# Patient Record
Sex: Female | Born: 1980 | Race: White | Hispanic: No | Marital: Married | Smoking: Current every day smoker
Health system: Southern US, Community
[De-identification: ages and names within clinical notes are randomized; demographics above are authoritative.]

## PROBLEM LIST (undated history)

## (undated) DIAGNOSIS — J302 Other seasonal allergic rhinitis: Secondary | ICD-10-CM

## (undated) DIAGNOSIS — J45909 Unspecified asthma, uncomplicated: Secondary | ICD-10-CM

## (undated) DIAGNOSIS — F329 Major depressive disorder, single episode, unspecified: Secondary | ICD-10-CM

## (undated) DIAGNOSIS — F32A Depression, unspecified: Secondary | ICD-10-CM

## (undated) HISTORY — PX: CHOLECYSTECTOMY: SHX55

---

## 2016-07-17 ENCOUNTER — Emergency Department (HOSPITAL_COMMUNITY)
Admission: EM | Admit: 2016-07-17 | Discharge: 2016-07-17 | Disposition: A | Discharge status: Home / Self Care | Payer: Self-pay | Attending: Emergency Medicine | Admitting: Emergency Medicine

## 2016-07-17 ENCOUNTER — Encounter (HOSPITAL_COMMUNITY): Payer: Self-pay | Admitting: *Deleted

## 2016-07-17 DIAGNOSIS — Z791 Long term (current) use of non-steroidal anti-inflammatories (NSAID): Secondary | ICD-10-CM | POA: Insufficient documentation

## 2016-07-17 DIAGNOSIS — K047 Periapical abscess without sinus: Secondary | ICD-10-CM | POA: Insufficient documentation

## 2016-07-17 MED ORDER — AMOXICILLIN 250 MG PO CAPS
500.0000 mg | ORAL_CAPSULE | Freq: Once | ORAL | Status: AC
  Administered 2016-07-17: 500 mg via ORAL
  Filled 2016-07-17: qty 2

## 2016-07-17 MED ORDER — AMOXICILLIN 500 MG PO CAPS: 500.0000 mg | ORAL_CAPSULE | Freq: Three times a day (TID) | ORAL | 0 refills | Status: AC

## 2016-07-17 MED ORDER — HYDROCODONE-ACETAMINOPHEN 5-325 MG PO TABS
1.0000 | ORAL_TABLET | Freq: Once | ORAL | Status: AC
  Administered 2016-07-17: 1 via ORAL
  Filled 2016-07-17: qty 1

## 2016-07-17 MED ORDER — HYDROCODONE-ACETAMINOPHEN 5-325 MG PO TABS: 1.0000 | ORAL_TABLET | ORAL | 0 refills | Status: AC | PRN

## 2016-07-17 NOTE — ED Notes (Addendum)
Pt reports that her upper incisor broke 6 months ago and since it has been cold has been painful. States she recently moved here and that she has no dentist  Her upper R tooth is broke and decayed to the gum line

## 2016-07-17 NOTE — ED Triage Notes (Signed)
Pt reports upper right dental pain that started Thursday. Pt has no dentist and is new to the area.

## 2016-07-17 NOTE — Discharge Instructions (Signed)
Complete your entire course of antibiotics as prescribed.  You  may use the hydrocodone for pain relief but do not drive within 4 hours of taking as this will make you drowsy.  Cut back on your ibuprofen as discussed, the maximum safe dose is 4 tablets every 8 hours.  Avoid applying heat or ice to your site of pain which can worsen your symptoms.  You may use warm salt water swish and spit treatment or half peroxide and water swish and spit after meals to keep this area clean as discussed.  Call the dentist listed above for further management of your symptoms.

## 2016-07-17 NOTE — ED Provider Notes (Signed)
[facility]-EMER[facility]Y DEPT Provider Note   CSN: [id number] Arrival date & time: 07/17/16  1427     History   Chief Complaint Chief Complaint  Patient presents with  . Dental Pain    HPI Kimberly Hurst is a 36 y.o. female presenting with a chronically fractured tooth for the past 6 months which has become increasingly cold sensitive, but also reporting persistent throbbing pain in the tooth for the past week.   There has been no fevers, chills, nausea or vomiting, also no complaint of difficulty swallowing, although chewing makes pain worse.  The patient has take ibuprofen 800 mg every 6 hours without relief of symptoms.      The history is provided by the patient.    History reviewed. No pertinent past medical history.  There are no active problems to display for this patient.   Past Surgical History:  Procedure Laterality Date  . CESAREAN SECTION     x3  . CHOLECYSTECTOMY      OB History    Gravida Para Term Preterm AB Living   3         3   SAB TAB Ectopic Multiple Live Births                   Home Medications    Prior to Admission medications   Medication Sig Start Date End Date Taking? Authorizing Provider  ibuprofen (ADVIL,MOTRIN) 200 MG tablet Take 800 mg by mouth every 6 (six) hours as needed.   Yes Historical Provider, MD  amoxicillin (AMOXIL) 500 MG capsule Take 1 capsule (500 mg total) by mouth 3 (three) times daily. 07/17/16 07/27/16  Burgess AmorJulie Mabell Esguerra, PA-C  HYDROcodone-acetaminophen (NORCO/VICODIN) 5-325 MG tablet Take 1 tablet by mouth every 4 (four) hours as needed. 07/17/16   Burgess AmorJulie Siboney Requejo, PA-C    Family History No family history on file.  Social History Social History  Substance Use Topics  . Smoking status: Never Smoker  . Smokeless tobacco: Never Used  . Alcohol use No     Allergies   Patient has no known allergies.   Review of Systems Review of Systems  Constitutional: Negative for fever.  HENT: Positive for dental problem. Negative for facial  swelling and sore throat.   Respiratory: Negative for shortness of breath.   Musculoskeletal: Negative for neck pain and neck stiffness.     Physical Exam Updated Vital Signs BP 138/68 (BP Location: Left Arm)   Pulse 76   Temp 97.2 F (36.2 C) (Temporal)   Resp 20   Ht 5\' 5"  (1.651 m)   Wt 129.3 kg   LMP 07/04/2016   SpO2 100%   BMI 47.43 kg/m   Physical Exam  Constitutional: She is oriented to person, place, and time. She appears well-developed and well-nourished. No distress.  HENT:  Head: Normocephalic and atraumatic.  Right Ear: Tympanic membrane and external ear normal.  Left Ear: Tympanic membrane and external ear normal.  Mouth/Throat: Oropharynx is clear and moist and mucous membranes are normal. No oral lesions. No trismus in the jaw. Dental abscesses present.    Eyes: Conjunctivae are normal.  Neck: Normal range of motion. Neck supple.  Cardiovascular: Normal rate and normal heart sounds.   Pulmonary/Chest: Effort normal.  Abdominal: She exhibits no distension.  Musculoskeletal: Normal range of motion.  Lymphadenopathy:    She has no cervical adenopathy.  Neurological: She is alert and oriented to person, place, and time.  Skin: Skin is warm and dry. No erythema.  Psychiatric: She has a normal mood and affect.     ED Treatments / Results  Labs (all labs ordered are listed, but only abnormal results are displayed) Labs Reviewed - No data to display  EKG  EKG Interpretation None       Radiology No results found.  Procedures Procedures (including critical care time)  Medications Ordered in ED Medications  HYDROcodone-acetaminophen (NORCO/VICODIN) 5-325 MG per tablet 1 tablet (1 tablet Oral Given 07/17/16 1535)  amoxicillin (AMOXIL) capsule 500 mg (500 mg Oral Given 07/17/16 1534)     Initial Impression / Assessment and Plan / ED Course  I have reviewed the triage vital signs and the nursing notes.  Pertinent labs & imaging results that were  available during my care of the patient were reviewed by me and considered in my medical decision making (see chart for details).  Clinical Course     Pt with probable early dental abscess.  Amoxil, hydrocodone, advised ibuprofen only q 8 hours. Dental referrals given.    The patient appears reasonably screened and/or stabilized for discharge and I doubt any other medical condition or other [facility] requiring further screening, evaluation, or treatment in the ED at this time prior to discharge.   Final Clinical Impressions(s) / ED Diagnoses   Final diagnoses:  Dental infection    New Prescriptions Discharge Medication List as of 07/17/2016  3:26 PM    START taking these medications   Details  amoxicillin (AMOXIL) 500 MG capsule Take 1 capsule (500 mg total) by mouth 3 (three) times daily., Starting Sat 07/17/2016, Until Tue 07/27/2016, Print    HYDROcodone-acetaminophen (NORCO/VICODIN) 5-325 MG tablet Take 1 tablet by mouth every 4 (four) hours as needed., Starting Sat 07/17/2016, Print         Burgess Amor, PA-C 07/17/16 1635    Vanetta Mulders, MD 07/20/16 [phone]

## 2016-09-21 ENCOUNTER — Emergency Department (HOSPITAL_COMMUNITY)
Admission: EM | Admit: 2016-09-21 | Discharge: 2016-09-21 | Disposition: A | Discharge status: Home / Self Care | Payer: Self-pay | Attending: Emergency Medicine | Admitting: Emergency Medicine

## 2016-09-21 ENCOUNTER — Encounter (HOSPITAL_COMMUNITY): Payer: Self-pay

## 2016-09-21 DIAGNOSIS — F17210 Nicotine dependence, cigarettes, uncomplicated: Secondary | ICD-10-CM | POA: Insufficient documentation

## 2016-09-21 DIAGNOSIS — K529 Noninfective gastroenteritis and colitis, unspecified: Secondary | ICD-10-CM | POA: Insufficient documentation

## 2016-09-21 LAB — CBC
HEMATOCRIT: 40.2 % (ref 36.0–46.0)
HEMOGLOBIN: 13.2 g/dL (ref 12.0–15.0)
MCH: 28.3 pg (ref 26.0–34.0)
MCHC: 32.8 g/dL (ref 30.0–36.0)
MCV: 86.3 fL (ref 78.0–100.0)
Platelets: 121 10*3/uL — ABNORMAL LOW (ref 150–400)
RBC: 4.66 MIL/uL (ref 3.87–5.11)
RDW: 14.2 % (ref 11.5–15.5)
WBC: 5.8 10*3/uL (ref 4.0–10.5)

## 2016-09-21 LAB — COMPREHENSIVE METABOLIC PANEL
ALBUMIN: 3.7 g/dL (ref 3.5–5.0)
ALT: 18 U/L (ref 14–54)
ANION GAP: 9 (ref 5–15)
AST: 19 U/L (ref 15–41)
Alkaline Phosphatase: 36 U/L — ABNORMAL LOW (ref 38–126)
BUN: 8 mg/dL (ref 6–20)
CO2: 26 mmol/L (ref 22–32)
Calcium: 8.7 mg/dL — ABNORMAL LOW (ref 8.9–10.3)
Chloride: 102 mmol/L (ref 101–111)
Creatinine, Ser: 0.62 mg/dL (ref 0.44–1.00)
GFR calc Af Amer: >60 mL/min (ref >60)
GFR calc non Af Amer: >60 mL/min (ref >60)
GLUCOSE: 87 mg/dL (ref 65–99)
POTASSIUM: 4 mmol/L (ref 3.5–5.1)
SODIUM: 137 mmol/L (ref 135–145)
Total Bilirubin: 0.6 mg/dL (ref 0.3–1.2)
Total Protein: 6.5 g/dL (ref 6.5–8.1)

## 2016-09-21 LAB — URINALYSIS, ROUTINE W REFLEX MICROSCOPIC
Bilirubin Urine: NEGATIVE
Glucose, UA: NEGATIVE mg/dL
HGB URINE DIPSTICK: NEGATIVE
KETONES UR: NEGATIVE mg/dL
Leukocytes, UA: NEGATIVE
NITRITE: NEGATIVE
PROTEIN: NEGATIVE mg/dL
Specific Gravity, Urine: 1.01 (ref 1.005–1.030)
pH: 6 (ref 5.0–8.0)

## 2016-09-21 LAB — I-STAT BETA HCG BLOOD, ED (MC, WL, AP ONLY)

## 2016-09-21 LAB — LIPASE, BLOOD: LIPASE: 10 U/L — AB (ref 11–51)

## 2016-09-21 MED ORDER — SODIUM CHLORIDE 0.9 % IV BOLUS (SEPSIS)
1000.0000 mL | Freq: Once | INTRAVENOUS | Status: AC
  Administered 2016-09-21: 1000 mL via INTRAVENOUS

## 2016-09-21 MED ORDER — ONDANSETRON 4 MG PO TBDP: 4.0000 mg | ORAL_TABLET | Freq: Three times a day (TID) | ORAL | 0 refills | Status: AC | PRN

## 2016-09-21 MED ORDER — ONDANSETRON HCL 4 MG/2ML IJ SOLN
4.0000 mg | Freq: Once | INTRAMUSCULAR | Status: AC
  Administered 2016-09-21: 4 mg via INTRAVENOUS
  Filled 2016-09-21: qty 2

## 2016-09-21 MED ORDER — ACETAMINOPHEN 500 MG PO TABS
1000.0000 mg | ORAL_TABLET | Freq: Once | ORAL | Status: AC
  Administered 2016-09-21: 1000 mg via ORAL
  Filled 2016-09-21: qty 2

## 2016-09-21 NOTE — Discharge Instructions (Signed)
Take your medication as prescribed as needed for nausea. I recommend taking Tylenol and ibuprofen as prescribed over-the-counter as needed for pain and body aches. Continue drinking fluids at home to remain hydrated. I recommend eating a bland diet for the next few days and see her symptoms have improved. Please follow up with a primary care provider from the Resource Guide provided below in 3-4 days as needed. Please return to the Emergency Department if symptoms worsen or new onset of fever, chest pain, difficulty breathing, new/worsening abdominal pain, vomiting, unable to keep fluids down.

## 2016-09-21 NOTE — ED Triage Notes (Signed)
Per Pt, Pt reports having generalized body aches and nausea that started yesterday. Pt reports having two episodes of vomiting since yesterday with photophobia. Pt tearful at triage.

## 2016-09-21 NOTE — ED Provider Notes (Signed)
[facility]-EMERGENCY DEPT Provider Note   CSN: [id number] Arrival date & time: 09/21/16  1049     History   Chief Complaint Chief Complaint  Patient presents with  . Generalized Body Aches  . Nausea    HPI Kimberly Hurst is a 36 y.o. female.  HPI   Patient is a 36 year old female with no pertinent past medical history presents the ED with complaint of generalized body aches. Patient reports yesterday afternoon she began having nausea and one episode of NBNB vomiting. She states that she woke up this morning she had generalized body aches with 2 episodes of vomiting and one episode of nonbloody diarrhea. She also endorses having a mild throbbing temporal headache that started today with associated photophobia. Patient reports having a similar migraine in the past but notes her headache today is less severe. Denies taking any medications at home for her symptoms. Denies fever, chills, dizziness, visual changes, eye drainage, rhinorrhea, sore throat, cough, shortness of breath, chest pain, abdominal pain, urinary symptoms, blood in urine or stool, vaginal bleeding, vaginal discharge, numbness, tingling, weakness, rash. Patient denies any known sick contacts. Denies any recent hospitalizations or recent use of antibiotics. Niacin any recent travel outside of the [place] surgery from fresh water sources. Endorses abdominal surgical history of cholecystectomy and 3 C-sections.  History reviewed. No pertinent past medical history.  There are no active problems to display for this patient.   Past Surgical History:  Procedure Laterality Date  . CESAREAN SECTION     x3  . CHOLECYSTECTOMY      OB History    Gravida Para Term Preterm AB Living   3         3   SAB TAB Ectopic Multiple Live Births                   Home Medications    Prior to Admission medications   Medication Sig Start Date End Date Taking? Authorizing Provider  ibuprofen (ADVIL,MOTRIN) 200 MG tablet Take 800 mg by mouth every  6 (six) hours as needed.   Yes Historical Provider, MD  HYDROcodone-acetaminophen (NORCO/VICODIN) 5-325 MG tablet Take 1 tablet by mouth every 4 (four) hours as needed. Patient not taking: Reported on 09/21/2016 07/17/16   Burgess AmorJulie Idol, PA-C  ondansetron (ZOFRAN ODT) 4 MG disintegrating tablet Take 1 tablet (4 mg total) by mouth every 8 (eight) hours as needed for nausea or vomiting. 09/21/16   Barrett HenleNicole Elizabeth Johnjoseph Rolfe, PA-C    Family History No family history on file.  Social History Social History  Substance Use Topics  . Smoking status: Current Every Day Smoker    Packs/day: 0.50    Types: Cigarettes  . Smokeless tobacco: Never Used  . Alcohol use No     Allergies   Patient has no known allergies.   Review of Systems Review of Systems  Eyes: Positive for photophobia.  Gastrointestinal: Positive for diarrhea, nausea and vomiting.  Musculoskeletal: Positive for myalgias (generalized).  Neurological: Positive for headaches.  All other systems reviewed and are negative.    Physical Exam Updated Vital Signs BP 130/70 (BP Location: Left Arm)   Pulse 99   Temp 97.7 F (36.5 C) (Oral)   Resp 22   Ht 5\' 3"  (1.6 m)   Wt 127.9 kg   LMP 08/13/2016 (Exact Date)   SpO2 100%   BMI 49.95 kg/m   Physical Exam  Constitutional: She is oriented to person, place, and time. She appears well-developed and well-nourished. No  distress.  HENT:  Head: Normocephalic and atraumatic.  Right Ear: Tympanic membrane normal.  Left Ear: Tympanic membrane normal.  Nose: Nose normal. Right sinus exhibits no maxillary sinus tenderness and no frontal sinus tenderness. Left sinus exhibits no maxillary sinus tenderness and no frontal sinus tenderness.  Mouth/Throat: Uvula is midline, oropharynx is clear and moist and mucous membranes are normal. No oropharyngeal exudate, posterior oropharyngeal edema, posterior oropharyngeal erythema or tonsillar abscesses. No tonsillar exudate.  Eyes: Conjunctivae and  EOM are normal. Pupils are equal, round, and reactive to light. Right eye exhibits no discharge. Left eye exhibits no discharge. No scleral icterus.  Neck: Normal range of motion. Neck supple.  Cardiovascular: Normal rate, regular rhythm, normal heart sounds and intact distal pulses.   Pulmonary/Chest: Effort normal and breath sounds normal. No respiratory distress. She has no wheezes. She has no rales. She exhibits no tenderness.  Abdominal: Soft. Bowel sounds are normal. She exhibits no distension and no mass. There is no tenderness. There is no rigidity, no rebound, no guarding, no CVA tenderness and negative Murphy's sign. No hernia.  Musculoskeletal: Normal range of motion. She exhibits no edema.  Lymphadenopathy:    She has no cervical adenopathy.  Neurological: She is alert and oriented to person, place, and time. She has normal strength. No cranial nerve deficit or sensory deficit. Coordination normal.  Skin: Skin is warm and dry. She is not diaphoretic.  Nursing note and vitals reviewed.    ED Treatments / Results  Labs (all labs ordered are listed, but only abnormal results are displayed) Labs Reviewed  LIPASE, BLOOD - Abnormal; Notable for the following:       Result Value   Lipase 10 (*)    All other components within normal limits  COMPREHENSIVE METABOLIC PANEL - Abnormal; Notable for the following:    Calcium 8.7 (*)    Alkaline Phosphatase 36 (*)    All other components within normal limits  CBC - Abnormal; Notable for the following:    Platelets 121 (*)    All other components within normal limits  URINALYSIS, ROUTINE W REFLEX MICROSCOPIC - Abnormal; Notable for the following:    APPearance HAZY (*)    All other components within normal limits  I-STAT BETA HCG BLOOD, ED ([facility], [facility], [facility] ONLY)    EKG  EKG Interpretation None       Radiology No results found.  Procedures Procedures (including critical care time)  Medications Ordered in ED Medications  sodium  chloride 0.9 % bolus 1,000 mL (1,000 mLs Intravenous New Bag/Given 09/21/16 1422)  ondansetron (ZOFRAN) injection 4 mg (4 mg Intravenous Given 09/21/16 1421)  acetaminophen (TYLENOL) tablet 1,000 mg (1,000 mg Oral Given 09/21/16 1422)     Initial Impression / Assessment and Plan / ED Course  I have reviewed the triage vital signs and the nursing notes.  Pertinent labs & imaging results that were available during my care of the patient were reviewed by me and considered in my medical decision making (see chart for details).     Patient presents with generalized body aches, nausea, vomiting and diarrhea and mild headache that started yesterday. Denies any known sick contacts. VSS. Exam unremarkable. No abdominal tenderness. No neuro deficits. Patient given IV fluids, Zofran and Tylenol. Pregnancy negative. Labs unremarkable. On reevaluation  patient reports improvement of symptoms. Patient able to tolerate PO.  Suspect patient's symptoms are likely due to viral gastroenteritis. No indication of appendicitis, bowel obstruction, bowel perforation, cholecystitis, diverticulitis, PID or  ectopic pregnancy.  Patient discharged home with symptomatic treatment and given strict instructions for follow-up with their primary care physician.  I have also discussed reasons to return immediately to the ER.  Patient expresses understanding and agrees with plan.      Final Clinical Impressions(s) / ED Diagnoses   Final diagnoses:  Gastroenteritis, acute    New Prescriptions New Prescriptions   ONDANSETRON (ZOFRAN ODT) 4 MG DISINTEGRATING TABLET    Take 1 tablet (4 mg total) by mouth every 8 (eight) hours as needed for nausea or vomiting.     Satira Sark [place], PA-C 09/21/16 1504    Melene Plan, DO 09/21/16 1521

## 2017-04-23 ENCOUNTER — Emergency Department (HOSPITAL_COMMUNITY)
Admission: EM | Admit: 2017-04-23 | Discharge: 2017-04-23 | Disposition: A | Discharge status: Home / Self Care | Payer: Managed Care, Other (non HMO) | Attending: Emergency Medicine | Admitting: Emergency Medicine

## 2017-04-23 ENCOUNTER — Encounter (HOSPITAL_COMMUNITY): Payer: Self-pay | Admitting: Emergency Medicine

## 2017-04-23 ENCOUNTER — Emergency Department (HOSPITAL_COMMUNITY): Payer: Managed Care, Other (non HMO)

## 2017-04-23 DIAGNOSIS — Z79899 Other long term (current) drug therapy: Secondary | ICD-10-CM | POA: Diagnosis not present

## 2017-04-23 DIAGNOSIS — R110 Nausea: Secondary | ICD-10-CM | POA: Diagnosis not present

## 2017-04-23 DIAGNOSIS — J209 Acute bronchitis, unspecified: Secondary | ICD-10-CM

## 2017-04-23 DIAGNOSIS — R42 Dizziness and giddiness: Secondary | ICD-10-CM | POA: Insufficient documentation

## 2017-04-23 DIAGNOSIS — R05 Cough: Secondary | ICD-10-CM | POA: Diagnosis present

## 2017-04-23 DIAGNOSIS — F17210 Nicotine dependence, cigarettes, uncomplicated: Secondary | ICD-10-CM | POA: Insufficient documentation

## 2017-04-23 MED ORDER — PREDNISONE 20 MG PO TABS: 40.0000 mg | ORAL_TABLET | Freq: Every day | ORAL | 0 refills | Status: DC

## 2017-04-23 MED ORDER — ALBUTEROL SULFATE HFA 108 (90 BASE) MCG/ACT IN AERS: 2.0000 | INHALATION_SPRAY | RESPIRATORY_TRACT | 3 refills | Status: AC | PRN

## 2017-04-23 MED ORDER — ALBUTEROL SULFATE (2.5 MG/3ML) 0.083% IN NEBU
5.0000 mg | INHALATION_SOLUTION | Freq: Once | RESPIRATORY_TRACT | Status: AC
  Administered 2017-04-23: 5 mg via RESPIRATORY_TRACT
  Filled 2017-04-23: qty 6

## 2017-04-23 MED ORDER — BENZONATATE 100 MG PO CAPS: 100.0000 mg | ORAL_CAPSULE | Freq: Three times a day (TID) | ORAL | 0 refills | Status: AC

## 2017-04-23 MED ORDER — BENZONATATE 100 MG PO CAPS
200.0000 mg | ORAL_CAPSULE | Freq: Once | ORAL | Status: AC
  Administered 2017-04-23: 200 mg via ORAL
  Filled 2017-04-23: qty 2

## 2017-04-23 NOTE — ED Notes (Signed)
Patient transported to X-ray 

## 2017-04-23 NOTE — ED Provider Notes (Signed)
[facility] Provider Note   CSN: [id number] Arrival date & time: 04/23/17  4098     History   Chief Complaint Chief Complaint  Patient presents with  . Cough    HPI Kimberly Hurst is a 36 y.o. female.  HPI  The patient is a 36 year old female with prior medical history of asthma as a child, denies taking any current daily medications, states that she has started to have some increased coughing productive of white phlegm with some green specks, also having increased amounts of wheezing over the last several days. No medications at home. She does not have an inhaler. She denies fevers or chills. She feels generally poorly with body aches, dizziness which she describes as intermittent vertigo and a headache. She has not had anybody around her with similar complaints, no recent travel  History reviewed. No pertinent past medical history.  There are no active problems to display for this patient.   Past Surgical History:  Procedure Laterality Date  . CESAREAN SECTION     x3  . CHOLECYSTECTOMY      OB History    Gravida Para Term Preterm AB Living   SAB TAB Ectopic Multiple Live Births                   Home Medications    Prior to Admission medications   Medication Sig Start Date End Date Taking? Authorizing Provider  albuterol (PROVENTIL HFA;VENTOLIN HFA) 108 (90 Base) MCG/ACT inhaler Inhale 2 puffs into the lungs every 4 (four) hours as needed for wheezing or shortness of breath. 04/23/17   Eber Hong, MD  benzonatate (TESSALON) 100 MG capsule Take 1 capsule (100 mg total) by mouth every 8 (eight) hours. 04/23/17   Eber Hong, MD  HYDROcodone-acetaminophen (NORCO/VICODIN) 5-325 MG tablet Take 1 tablet by mouth every 4 (four) hours as needed. Patient not taking: Reported on 09/21/2016 07/17/16   Burgess Amor, PA-C  ibuprofen (ADVIL,MOTRIN) 200 MG tablet Take 800 mg by mouth every 6 (six) hours as needed.    [provider]    ondansetron (ZOFRAN ODT) 4 MG disintegrating tablet Take 1 tablet (4 mg total) by mouth every 8 (eight) hours as needed for nausea or vomiting. 09/21/16   Barrett Henle, PA-C  predniSONE (DELTASONE) 20 MG tablet Take 2 tablets (40 mg total) by mouth daily. 04/23/17   Eber Hong, MD    Family History History reviewed. No pertinent family history.  Social History Social History  Substance Use Topics  . Smoking status: Current Every Day Smoker    Packs/day: 0.50    Types: Cigarettes  . Smokeless tobacco: Never Used  . Alcohol use No     Allergies   Patient has no known allergies.   Review of Systems Review of Systems  Constitutional: Negative for chills and fever.  HENT: Positive for congestion. Negative for sore throat.   Eyes: Negative for visual disturbance.  Respiratory: Positive for cough, shortness of breath and wheezing.   Cardiovascular: Positive for chest pain.  Gastrointestinal: Positive for nausea. Negative for abdominal pain, diarrhea and vomiting.  Genitourinary: Negative for dysuria and frequency.  Musculoskeletal: Negative for back pain and neck pain.  Skin: Negative for rash.  Neurological: Positive for dizziness. Negative for weakness, numbness and headaches.  Hematological: Negative for adenopathy.  Psychiatric/Behavioral: Negative for behavioral problems.     Physical Exam Updated Vital Signs BP 112/80 (BP Location: Right Arm)  Pulse (!) 105   Temp 98.5 F (36.9 C) (Oral)   Resp (!) 22   Ht  (1.6 m)   Wt 130.2 kg (287 lb)   LMP 04/16/2017   SpO2 96%   BMI 50.84 kg/m   Physical Exam  Constitutional: She appears well-developed and well-nourished. No distress.  HENT:  Head: Normocephalic and atraumatic.  Mouth/Throat: Oropharynx is clear and moist. No oropharyngeal exudate.  TM's clear bilaterally  Eyes: Pupils are equal, round, and reactive to light. Conjunctivae and EOM are normal. Right eye exhibits no discharge. Left eye  exhibits no discharge. No scleral icterus.  Neck: Normal range of motion. Neck supple. No JVD present. No thyromegaly present.  Cardiovascular: Normal rate, regular rhythm, normal heart sounds and intact distal pulses.  Exam reveals no gallop and no friction rub.   No murmur heard. Pulmonary/Chest: Effort normal. No respiratory distress. She has wheezes. She has no rales.  Speaks in full sentences, no rales, no increased work of breathing, mild tachypnea  Abdominal: Soft. Bowel sounds are normal. She exhibits no distension and no mass. There is no tenderness.  Musculoskeletal: Normal range of motion. She exhibits no edema or tenderness.  Lymphadenopathy:    She has no cervical adenopathy.  Neurological: She is alert. Coordination normal.  Normal speech, normal gait, normal coordination, normal memory, normal cranial nerves III through XII,  Skin: Skin is warm and dry. No rash noted. No erythema.  Psychiatric: She has a normal mood and affect. Her behavior is normal.  Nursing note and vitals reviewed.    ED Treatments / Results  Labs (all labs ordered are listed, but only abnormal results are displayed) Labs Reviewed - No data to display   Radiology Dg Chest 2 View  Result Date: 04/23/2017 CLINICAL DATA:  Cough with shortness of breath and wheezing. EXAM: CHEST  2 VIEW COMPARISON:  None. FINDINGS: Large lung volumes and mild interstitial coarsening. There is no edema, air bronchogram, effusion, or pneumothorax. Normal heart size and mediastinal contours. IMPRESSION: Question bronchitis and hyperinflation. Electronically Signed   By: Marnee Spring M.D.   On: 04/23/2017 07:51    Procedures Procedures (including critical care time)  Medications Ordered in ED Medications  albuterol (PROVENTIL) (2.5 MG/3ML) 0.083% nebulizer solution 5 mg (not administered)  benzonatate (TESSALON) capsule 200 mg (200 mg Oral Given 04/23/17 0717)     Initial Impression / Assessment and Plan / ED  Course  I have reviewed the triage vital signs and the nursing notes.  Pertinent labs & imaging results that were available during my care of the patient were reviewed by me and considered in my medical decision making (see chart for details).    Presentation consistent with respiratory illness, likely viral, check chest x-ray given the productive sputum with tachypnea and wheezing. Patient will be given albuterol nebulizer, Tessalon, the rest of her exam is unremarkable including her neurologic exam. Suspect inner ear condition with head pressure congestion causing the vertigo. Doubt neurologic cause  Has xray c/w hyperinflation / bronchitis C/w clinical pictures Nebs given Pt stable for d;c.  Vitals:   04/23/17 0709  BP: 112/80  Pulse: (!) 105  Resp: (!) 22  Temp: 98.5 F (36.9 C)  TempSrc: Oral  SpO2: 96%  Weight: 130.2 kg (287 lb)  Height:  (1.6 m)     Final Clinical Impressions(s) / ED Diagnoses   Final diagnoses:  Acute bronchitis, unspecified organism    New Prescriptions New Prescriptions   ALBUTEROL (PROVENTIL  HFA;VENTOLIN HFA) 108 (90 BASE) MCG/ACT INHALER    Inhale 2 puffs into the lungs every 4 (four) hours as needed for wheezing or shortness of breath.   BENZONATATE (TESSALON) 100 MG CAPSULE    Take 1 capsule (100 mg total) by mouth every 8 (eight) hours.   PREDNISONE (DELTASONE) 20 MG TABLET    Take 2 tablets (40 mg total) by mouth daily.     Eber Hong, MD 04/23/17 [phone]

## 2017-04-23 NOTE — ED Triage Notes (Addendum)
Patient c/o cough with congestion, nausea, dizziness, and body aches. Denies any fevers. Per patient took Mucinex yesterday and now has some white foam and green thick sputum with cough. Patient denies ear "pain" but reports ear irritation "itching." Denies any ear drainage.

## 2017-04-23 NOTE — Discharge Instructions (Signed)
Xray shows no pneumonia  Tessalon every 8 hours as needed for coughing Albuterol every 4 hours for cough or wheezing Prednisone daily for 5 days  ER for severe or worsening symptoms.  [facility] Primary Care Doctor List    Kari Baars MD. Specialty: Pulmonary Disease Contact information: [address]  [address]  [place] [address]  [phone]   Syliva Overman, MD. Specialty: [facility] Medicine Contact information: [address]  [place] [address]  [phone]   Lilyan Punt, MD. Specialty: [facility] Medicine Contact information: [address]  [place] [address]  [phone]   Avon Gully, MD Specialty: Internal Medicine Contact information: [address] [place] [address]  [phone]   Catalina Pizza, MD. Specialty: Internal Medicine Contact information: [address]  [place] [address]  [phone]    [facility] (Dr. Selena Batten) Specialty: Family Medicine Contact information: [address]  [place] [address]  [phone]   John Giovanni, MD. Specialty: [facility] Medicine Contact information: [address]  [address]  [place] [address]  [phone]   Carylon Perches, MD. Specialty: Internal Medicine Contact information: [address]  [address]  [place] [address]  [phone]    [facility]  [address] [place], [place] [address] [phone]  Services [facility] offers a variety of basic health services.  Services include but are not limited to: Blood pressure checks  Heart rate checks  Blood sugar checks  Urine analysis  Rapid strep tests  Pregnancy tests.  Health education and referrals  People needing more complex services will be directed to a physician online. Using these virtual visits, doctors can evaluate and prescribe medicine and treatments. There will be no medication on-site,  though [facility] will help patients fill their prescriptions at little to no cost.   For More information please go to: [email or web address]

## 2017-09-19 ENCOUNTER — Ambulatory Visit: Payer: Managed Care, Other (non HMO) | Admitting: Nurse Practitioner

## 2017-09-19 DIAGNOSIS — Z0289 Encounter for other administrative examinations: Secondary | ICD-10-CM

## 2017-09-25 ENCOUNTER — Emergency Department (HOSPITAL_COMMUNITY): Payer: Managed Care, Other (non HMO)

## 2017-09-25 ENCOUNTER — Emergency Department (HOSPITAL_COMMUNITY)
Admission: EM | Admit: 2017-09-25 | Discharge: 2017-09-25 | Disposition: A | Discharge status: Home / Self Care | Payer: Managed Care, Other (non HMO) | Attending: Emergency Medicine | Admitting: Emergency Medicine

## 2017-09-25 ENCOUNTER — Other Ambulatory Visit: Payer: Self-pay

## 2017-09-25 ENCOUNTER — Encounter (HOSPITAL_COMMUNITY): Payer: Self-pay | Admitting: Emergency Medicine

## 2017-09-25 DIAGNOSIS — R062 Wheezing: Secondary | ICD-10-CM | POA: Diagnosis present

## 2017-09-25 DIAGNOSIS — J4541 Moderate persistent asthma with (acute) exacerbation: Secondary | ICD-10-CM | POA: Diagnosis not present

## 2017-09-25 DIAGNOSIS — F17210 Nicotine dependence, cigarettes, uncomplicated: Secondary | ICD-10-CM | POA: Insufficient documentation

## 2017-09-25 DIAGNOSIS — J069 Acute upper respiratory infection, unspecified: Secondary | ICD-10-CM | POA: Diagnosis not present

## 2017-09-25 DIAGNOSIS — J45901 Unspecified asthma with (acute) exacerbation: Secondary | ICD-10-CM

## 2017-09-25 HISTORY — DX: Unspecified asthma, uncomplicated: J45.909

## 2017-09-25 HISTORY — DX: Major depressive disorder, single episode, unspecified: F32.9

## 2017-09-25 HISTORY — DX: Depression, unspecified: F32.A

## 2017-09-25 HISTORY — DX: Other seasonal allergic rhinitis: J30.2

## 2017-09-25 MED ORDER — DEXAMETHASONE SODIUM PHOSPHATE 10 MG/ML IJ SOLN
10.0000 mg | Freq: Once | INTRAMUSCULAR | Status: AC
  Administered 2017-09-25: 10 mg via INTRAMUSCULAR
  Filled 2017-09-25: qty 1

## 2017-09-25 MED ORDER — DEXAMETHASONE 4 MG PO TABS: 4.0000 mg | ORAL_TABLET | Freq: Two times a day (BID) | ORAL | 0 refills | Status: AC

## 2017-09-25 MED ORDER — IPRATROPIUM-ALBUTEROL 0.5-2.5 (3) MG/3ML IN SOLN
3.0000 mL | Freq: Once | RESPIRATORY_TRACT | Status: AC
Start: 2017-09-25 — End: 2017-09-25
  Administered 2017-09-25: 3 mL via RESPIRATORY_TRACT
  Filled 2017-09-25: qty 3

## 2017-09-25 MED ORDER — ALBUTEROL SULFATE HFA 108 (90 BASE) MCG/ACT IN AERS
3.0000 | INHALATION_SPRAY | Freq: Once | RESPIRATORY_TRACT | Status: AC
Start: 2017-09-25 — End: 2017-09-25
  Administered 2017-09-25: 3 via RESPIRATORY_TRACT
  Filled 2017-09-25: qty 6.7

## 2017-09-25 MED ORDER — ALBUTEROL SULFATE (2.5 MG/3ML) 0.083% IN NEBU: 5.0000 mg | INHALATION_SOLUTION | Freq: Once | RESPIRATORY_TRACT | Status: DC

## 2017-09-25 MED ORDER — DIPHENHYDRAMINE HCL 12.5 MG/5ML PO ELIX
12.5000 mg | ORAL_SOLUTION | Freq: Once | ORAL | Status: AC
Start: 2017-09-25 — End: 2017-09-25
  Administered 2017-09-25: 12.5 mg via ORAL
  Filled 2017-09-25: qty 5

## 2017-09-25 NOTE — Discharge Instructions (Signed)
Your asthma exacerbation shows improvement with albuterol.  You have been treated with steroid medication.  Please use 2 puffs of albuterol every 4 hours for the next 2 days, and then every 4 hours as needed.  Please use Decadron 2 times daily with food.  Please use the decongesting medication of your choice (Dimetapp, Claritin-D, etc.).  Please increase fluids.  Wash hands frequently.  Please stop smoking.

## 2017-09-25 NOTE — ED Provider Notes (Signed)
[facility] EMERGENCY DEPARTMENT Provider Note   CSN: [id number] Arrival date & time: 09/25/17  1454     History   Chief Complaint Chief Complaint  Patient presents with  . Wheezing    HPI Kimberly Hurst is a 37 y.o. female.  Patient is a 37 year old female who presents to the emergency department with complaint of cough and wheezing.  The patient states this problem started on yesterday March 16.  The patient states that she had asthma approximately 20 years ago, has not really had any problems with asthma since that time.  She admits that she is a smoker.  She states that she has had upper respiratory symptoms recently with nasal congestion, cough, aching, and fatigue.  She has not measured any temperature elevations.  No other respiratory related illnesses to be reported.      Past Medical History:  Diagnosis Date  . Asthma   . Depression   . Seasonal allergies     There are no active problems to display for this patient.   Past Surgical History:  Procedure Laterality Date  . CESAREAN SECTION     x3  . CHOLECYSTECTOMY      OB History    Gravida Para Term Preterm AB Living   3 3 3     3    SAB TAB Ectopic Multiple Live Births                   Home Medications    Prior to Admission medications   Medication Sig Start Date End Date Taking? Authorizing Provider  albuterol (PROVENTIL HFA;VENTOLIN HFA) 108 (90 Base) MCG/ACT inhaler Inhale 2 puffs into the lungs every 4 (four) hours as needed for wheezing or shortness of breath. 04/23/17   Eber Hong, MD  benzonatate (TESSALON) 100 MG capsule Take 1 capsule (100 mg total) by mouth every 8 (eight) hours. 04/23/17   Eber Hong, MD  HYDROcodone-acetaminophen (NORCO/VICODIN) 5-325 MG tablet Take 1 tablet by mouth every 4 (four) hours as needed. Patient not taking: Reported on 09/21/2016 07/17/16   Burgess Amor, PA-C  ibuprofen (ADVIL,MOTRIN) 200 MG tablet Take 800 mg by mouth every 6 (six) hours as needed.     [provider]  ondansetron (ZOFRAN ODT) 4 MG disintegrating tablet Take 1 tablet (4 mg total) by mouth every 8 (eight) hours as needed for nausea or vomiting. 09/21/16   Barrett Henle, PA-C  predniSONE (DELTASONE) 20 MG tablet Take 2 tablets (40 mg total) by mouth daily. 04/23/17   Eber Hong, MD    Family History No family history on file.  Social History Social History   Tobacco Use  . Smoking status: Current Every Day Smoker    Packs/day: 0.50    Types: Cigarettes  . Smokeless tobacco: Never Used  Substance Use Topics  . Alcohol use: No  . Drug use: No     Allergies   Patient has no known allergies.   Review of Systems Review of Systems  Constitutional: Positive for fatigue. Negative for activity change and fever.       All ROS Neg except as noted in HPI  HENT: Positive for congestion and postnasal drip. Negative for nosebleeds.   Eyes: Negative for photophobia and discharge.  Respiratory: Positive for cough, shortness of breath and wheezing.   Cardiovascular: Negative for chest pain and palpitations.  Gastrointestinal: Negative for abdominal pain and blood in stool.  Genitourinary: Negative for dysuria, frequency and hematuria.  Musculoskeletal: Negative for arthralgias, back  pain and neck pain.  Skin: Negative.   Neurological: Negative for dizziness, seizures and speech difficulty.  Psychiatric/Behavioral: Negative for confusion and hallucinations.     Physical Exam Updated Vital Signs BP (!) 142/103 (BP Location: Right Arm)   Pulse 92   Temp 98 F (36.7 C) (Oral)   Resp (!) 28 Comment: pt hyperventilating and while temp probe in mout and breathing normally sats 100 %  Ht 5\' 5"  (1.651 m)   Wt (!) 158.8 kg (350 lb)   LMP 09/11/2017   SpO2 99%   BMI 58.24 kg/m   Physical Exam  Constitutional: She is oriented to person, place, and time. She appears well-developed and well-nourished.  Non-toxic appearance.  Morbid obesity  HENT:    Head: Normocephalic.  Right Ear: Tympanic membrane and external ear normal.  Left Ear: Tympanic membrane and external ear normal.  Eyes: EOM and lids are normal. Pupils are equal, round, and reactive to light.  Neck: Normal range of motion. Neck supple. Carotid bruit is not present.  Cardiovascular: Normal rate, regular rhythm, normal heart sounds, intact distal pulses and normal pulses.  Pulmonary/Chest: No respiratory distress. She has wheezes.  Respiratory rate 28 Wheezes noted at the bedside. No use of assessory muscles. Pt speaks in complete sentences.  Abdominal: Soft. Bowel sounds are normal. There is no tenderness. There is no guarding.  Musculoskeletal: Normal range of motion.  1+ edema of the lower extremities.  Lymphadenopathy:       Head (right side): No submandibular adenopathy present.       Head (left side): No submandibular adenopathy present.    She has no cervical adenopathy.  Neurological: She is alert and oriented to person, place, and time. She has normal strength. No cranial nerve deficit or sensory deficit.  Skin: Skin is warm and dry.  Psychiatric: She has a normal mood and affect. Her speech is normal.  Nursing note and vitals reviewed.    ED Treatments / Results  Labs (all labs ordered are listed, but only abnormal results are displayed) Labs Reviewed - No data to display  EKG  EKG Interpretation None       Radiology Dg Chest 2 View  Result Date: 09/25/2017 CLINICAL DATA:  37 year old female with shortness of breath wheezing and cough since yesterday. Right chest pain radiating to the right shoulder and back for 2 days. EXAM: CHEST - 2 VIEW COMPARISON:  Chest radiographs 04/23/2017. FINDINGS: Large body habitus. Lung volumes are stable and within normal limits. Normal cardiac size and mediastinal contours. Visualized tracheal air column is within normal limits. Stable bilateral lung parenchyma. No pneumothorax, pleural effusion or acute pulmonary  opacity. No acute osseous abnormality identified. Paucity of bowel gas in the upper abdomen. IMPRESSION: No acute cardiopulmonary abnormality. Electronically Signed   By: Odessa Fleming M.D.   On: 09/25/2017 15:49    Procedures Procedures (including critical care time)  Medications Ordered in ED Medications  albuterol (PROVENTIL) (2.5 MG/3ML) 0.083% nebulizer solution 5 mg (not administered)     Initial Impression / Assessment and Plan / ED Course  I have reviewed the triage vital signs and the nursing notes.  Pertinent labs & imaging results that were available during my care of the patient were reviewed by me and considered in my medical decision making (see chart for details).      Final Clinical Impressions(s) / ED Diagnoses MDM  Blood pressure is elevated at 142/103.  I have asked the patient to have this rechecked soon.  Patient has wheezing that is audible at the bedside.  She is not using accessory muscles and she is speaking in complete sentences.  Patient was treated with albuterol, Atrovent, and Decadron.  Chest x-ray is negative for pneumonia or any other acute problems.  Recheck patient's respiratory rate has slowed down significantly.  Capillary refill is less than 2 seconds.  Patient states she is no longer short of breath and feels much better.  She continues to have some soft wheezing, but much improved from admission.  Patient is ambulatory with minimal problem.  The patient feels that with the medication she can handle this at home at this point. Patient is given an albuterol inhaler with instructions to use 2 puffs every 4 hours over the next 2 days, and then every 4 hours as needed for wheezing and shortness of breath.  Patient is given a prescription for Decadron twice daily.  The patient will use the decongesting medication of her choice.  I have asked the patient to stop smoking.  I have asked her to see her primary physician or return to the emergency department if any  changes in her condition, problems, or concerns.  Patient is in agreement with this plan.    Final diagnoses:  Moderate asthma with exacerbation, unspecified whether persistent  Upper respiratory tract infection, unspecified type    ED Discharge Orders    None       Ivery QualeBryant, Moria Brophy, PA-C 09/25/17 1752    Donnetta Hutchingook, Brian, MD 09/28/17 [phone]

## 2017-09-25 NOTE — ED Triage Notes (Signed)
Patient c/o wheezing with congested cough that started yesterday. Per patient hx of asthma but states "I haven't had a asthma attack in years." Denies any fevers. Patient states difficulty catching my breath. Patient states that she also has pain in right shoulder that radiates into back and right breast that started 2 days ago.

## 2018-05-30 ENCOUNTER — Other Ambulatory Visit: Payer: Self-pay

## 2018-05-30 ENCOUNTER — Emergency Department (HOSPITAL_COMMUNITY)
Admission: EM | Admit: 2018-05-30 | Discharge: 2018-05-30 | Disposition: A | Discharge status: Home / Self Care | Payer: Self-pay | Attending: Emergency Medicine | Admitting: Emergency Medicine

## 2018-05-30 ENCOUNTER — Encounter (HOSPITAL_COMMUNITY): Payer: Self-pay

## 2018-05-30 ENCOUNTER — Emergency Department (HOSPITAL_COMMUNITY): Payer: Self-pay

## 2018-05-30 DIAGNOSIS — R0602 Shortness of breath: Secondary | ICD-10-CM | POA: Insufficient documentation

## 2018-05-30 DIAGNOSIS — J4521 Mild intermittent asthma with (acute) exacerbation: Secondary | ICD-10-CM | POA: Insufficient documentation

## 2018-05-30 DIAGNOSIS — Z79899 Other long term (current) drug therapy: Secondary | ICD-10-CM | POA: Insufficient documentation

## 2018-05-30 DIAGNOSIS — F17210 Nicotine dependence, cigarettes, uncomplicated: Secondary | ICD-10-CM | POA: Insufficient documentation

## 2018-05-30 LAB — COMPREHENSIVE METABOLIC PANEL
ALT: 21 U/L (ref 0–44)
AST: 24 U/L (ref 15–41)
Albumin: 3.5 g/dL (ref 3.5–5.0)
Alkaline Phosphatase: 40 U/L (ref 38–126)
Anion gap: 7 (ref 5–15)
BUN: 6 mg/dL (ref 6–20)
CHLORIDE: 103 mmol/L (ref 98–111)
CO2: 28 mmol/L (ref 22–32)
Calcium: 8.6 mg/dL — ABNORMAL LOW (ref 8.9–10.3)
Creatinine, Ser: 0.56 mg/dL (ref 0.44–1.00)
Glucose, Bld: 106 mg/dL — ABNORMAL HIGH (ref 70–99)
POTASSIUM: 4.1 mmol/L (ref 3.5–5.1)
SODIUM: 138 mmol/L (ref 135–145)
Total Bilirubin: 0.6 mg/dL (ref 0.3–1.2)
Total Protein: 7.2 g/dL (ref 6.5–8.1)

## 2018-05-30 LAB — CBC WITH DIFFERENTIAL/PLATELET
ABS IMMATURE GRANULOCYTES: 0.05 10*3/uL (ref 0.00–0.07)
BASOS PCT: 0 %
Basophils Absolute: 0 10*3/uL (ref 0.0–0.1)
EOS ABS: 0.1 10*3/uL (ref 0.0–0.5)
Eosinophils Relative: 2 %
HCT: 39 % (ref 36.0–46.0)
Hemoglobin: 12.1 g/dL (ref 12.0–15.0)
IMMATURE GRANULOCYTES: 1 %
Lymphocytes Relative: 20 %
Lymphs Abs: 1.4 10*3/uL (ref 0.7–4.0)
MCH: 27.1 pg (ref 26.0–34.0)
MCHC: 31 g/dL (ref 30.0–36.0)
MCV: 87.2 fL (ref 80.0–100.0)
Monocytes Absolute: 0.3 10*3/uL (ref 0.1–1.0)
Monocytes Relative: 5 %
NEUTROS ABS: 5 10*3/uL (ref 1.7–7.7)
NEUTROS PCT: 72 %
PLATELETS: 171 10*3/uL (ref 150–400)
RBC: 4.47 MIL/uL (ref 3.87–5.11)
RDW: 14.6 % (ref 11.5–15.5)
WBC: 6.9 10*3/uL (ref 4.0–10.5)
nRBC: 0 % (ref 0.0–0.2)

## 2018-05-30 MED ORDER — ALBUTEROL SULFATE (2.5 MG/3ML) 0.083% IN NEBU
2.5000 mg | INHALATION_SOLUTION | Freq: Once | RESPIRATORY_TRACT | Status: AC
  Administered 2018-05-30: 2.5 mg via RESPIRATORY_TRACT
  Filled 2018-05-30: qty 3

## 2018-05-30 MED ORDER — IPRATROPIUM-ALBUTEROL 0.5-2.5 (3) MG/3ML IN SOLN
3.0000 mL | Freq: Once | RESPIRATORY_TRACT | Status: AC
  Administered 2018-05-30: 3 mL via RESPIRATORY_TRACT
  Filled 2018-05-30: qty 3

## 2018-05-30 MED ORDER — METHYLPREDNISOLONE SODIUM SUCC 125 MG IJ SOLR
125.0000 mg | Freq: Once | INTRAMUSCULAR | Status: AC
  Administered 2018-05-30: 125 mg via INTRAVENOUS
  Filled 2018-05-30: qty 2

## 2018-05-30 MED ORDER — PREDNISONE 10 MG PO TABS: 20.0000 mg | ORAL_TABLET | Freq: Every day | ORAL | 0 refills | Status: AC

## 2018-05-30 MED ORDER — ALBUTEROL SULFATE HFA 108 (90 BASE) MCG/ACT IN AERS
2.0000 | INHALATION_SPRAY | RESPIRATORY_TRACT | Status: DC | PRN
  Filled 2018-05-30: qty 6.7

## 2018-05-30 NOTE — ED Provider Notes (Signed)
[facility] EMERGENCY DEPARTMENT Provider Note   CSN: [id number] Arrival date & time: 05/30/18  0757     History   Chief Complaint Chief Complaint  Patient presents with  . Cough  . Shortness of Breath    HPI Kimberly Hurst is a 37 y.o. female.  Patient complains of shortness of breath.  Patient has a history of asthma and has been using her inhaler frequently last few days  The history is provided by the patient. No language interpreter was used.  Cough  This is a new problem. The current episode started 2 days ago. The problem occurs constantly. The problem has not changed since onset.The cough is non-productive. Associated symptoms include shortness of breath and wheezing. Pertinent negatives include no chest pain and no headaches. She has tried nothing for the symptoms. The treatment provided no relief. Risk factors: asthma. She is not a smoker. Her past medical history is significant for asthma.  Shortness of Breath  Associated symptoms include cough and wheezing. Pertinent negatives include no headaches, no chest pain, no abdominal pain and no rash. Associated medical issues include asthma.    Past Medical History:  Diagnosis Date  . Asthma   . Depression   . Seasonal allergies     There are no active problems to display for this patient.   Past Surgical History:  Procedure Laterality Date  . CESAREAN SECTION     x3  . CHOLECYSTECTOMY       OB History    Gravida  3   Para  3   Term  3   Preterm      AB      Living  3     SAB      TAB      Ectopic      Multiple      Live Births               Home Medications    Prior to Admission medications   Medication Sig Start Date End Date Taking? Authorizing Provider  albuterol (PROVENTIL HFA;VENTOLIN HFA) 108 (90 Base) MCG/ACT inhaler Inhale 2 puffs into the lungs every 4 (four) hours as needed for wheezing or shortness of breath. 04/23/17  Yes Eber HongMiller, Brian, MD  FLUoxetine (PROZAC) 20 MG  capsule Take 20 mg by mouth daily.   Yes [provider]  ibuprofen (ADVIL,MOTRIN) 200 MG tablet Take 800 mg by mouth every 6 (six) hours as needed.   Yes [provider]  benzonatate (TESSALON) 100 MG capsule Take 1 capsule (100 mg total) by mouth every 8 (eight) hours. Patient not taking: Reported on 05/30/2018 04/23/17   Eber HongMiller, Brian, MD  dexamethasone (DECADRON) 4 MG tablet Take 1 tablet (4 mg total) by mouth 2 (two) times daily with a meal. Patient not taking: Reported on 05/30/2018 09/25/17   Ivery QualeBryant, Hobson, PA-C  HYDROcodone-acetaminophen (NORCO/VICODIN) 5-325 MG tablet Take 1 tablet by mouth every 4 (four) hours as needed. Patient not taking: Reported on 09/21/2016 07/17/16   Burgess AmorIdol, Julie, PA-C  ondansetron (ZOFRAN ODT) 4 MG disintegrating tablet Take 1 tablet (4 mg total) by mouth every 8 (eight) hours as needed for nausea or vomiting. Patient not taking: Reported on 05/30/2018 09/21/16   Barrett HenleNadeau, Nicole Elizabeth, PA-C  predniSONE (DELTASONE) 10 MG tablet Take 2 tablets (20 mg total) by mouth daily. 05/30/18   Bethann BerkshireZammit, Taleah Bellantoni, MD    Family History No family history on file.  Social History Social History   Tobacco Use  .  Smoking status: Current Every Day Smoker    Packs/day: 0.50    Types: Cigarettes  . Smokeless tobacco: Never Used  Substance Use Topics  . Alcohol use: No  . Drug use: No     Allergies   Patient has no known allergies.   Review of Systems Review of Systems  Constitutional: Negative for appetite change and fatigue.  HENT: Negative for congestion, ear discharge and sinus pressure.   Eyes: Negative for discharge.  Respiratory: Positive for cough, shortness of breath and wheezing.   Cardiovascular: Negative for chest pain.  Gastrointestinal: Negative for abdominal pain and diarrhea.  Genitourinary: Negative for frequency and hematuria.  Musculoskeletal: Negative for back pain.  Skin: Negative for rash.  Neurological: Negative for seizures  and headaches.  Psychiatric/Behavioral: Negative for hallucinations.     Physical Exam Updated Vital Signs BP (!) 109/56 (BP Location: Right Wrist)   Pulse 90   Temp 97.9 F (36.6 C) (Oral)   Resp 10   Ht 5\' 5"  (1.651 m)   Wt (!) 142.9 kg   LMP 05/12/2018   SpO2 100%   BMI 52.42 kg/m   Physical Exam  Constitutional: She is oriented to person, place, and time. She appears well-developed.  HENT:  Head: Normocephalic.  Eyes: Conjunctivae and EOM are normal. No scleral icterus.  Neck: Neck supple. No thyromegaly present.  Cardiovascular: Normal rate and regular rhythm. Exam reveals no gallop and no friction rub.  No murmur heard. Pulmonary/Chest: No stridor. She has wheezes. She has no rales. She exhibits no tenderness.  Abdominal: She exhibits no distension. There is no tenderness. There is no rebound.  Musculoskeletal: Normal range of motion. She exhibits no edema.  Lymphadenopathy:    She has no cervical adenopathy.  Neurological: She is oriented to person, place, and time. She exhibits normal muscle tone. Coordination normal.  Skin: No rash noted. No erythema.  Psychiatric: She has a normal mood and affect. Her behavior is normal.     ED Treatments / Results  Labs (all labs ordered are listed, but only abnormal results are displayed) Labs Reviewed  COMPREHENSIVE METABOLIC PANEL - Abnormal; Notable for the following components:      Result Value   Glucose, Bld 106 (*)    Calcium 8.6 (*)    All other components within normal limits  CBC WITH DIFFERENTIAL/PLATELET    EKG EKG Interpretation  Date/Time:  Tuesday May 30 2018 08:11:19 EST Ventricular Rate:  90 PR Interval:    QRS Duration: 94 QT Interval:  349 QTC Calculation: 427 R Axis:   87 Text Interpretation:  Sinus rhythm Low voltage, precordial leads Confirmed by Bethann Berkshire [phone]) on 05/30/2018 12:43:44 PM   Radiology Dg Chest 2 View  Result Date: 05/30/2018 CLINICAL DATA:  Chest  congestion, cough, shortness of breath, current smoker EXAM: CHEST - 2 VIEW COMPARISON:  09/25/2017 FINDINGS: Normal heart size, mediastinal contours, and pulmonary vascularity. Lungs clear. No pulmonary infiltrate, pleural effusion, or pneumothorax. Bones unremarkable. IMPRESSION: No acute abnormalities. Electronically Signed   By: Ulyses Southward M.D.   On: 05/30/2018 09:09    Procedures Procedures (including critical care time)  Medications Ordered in ED Medications  albuterol (PROVENTIL HFA;VENTOLIN HFA) 108 (90 Base) MCG/ACT inhaler 2 puff (has no administration in time range)  ipratropium-albuterol (DUONEB) 0.5-2.5 (3) MG/3ML nebulizer solution 3 mL (3 mLs Nebulization Given 05/30/18 0848)  albuterol (PROVENTIL) (2.5 MG/3ML) 0.083% nebulizer solution 2.5 mg (2.5 mg Nebulization Given 05/30/18 0848)  methylPREDNISolone sodium succinate (SOLU-MEDROL) 125  mg/2 mL injection 125 mg (125 mg Intravenous Given 05/30/18 0824)  ipratropium-albuterol (DUONEB) 0.5-2.5 (3) MG/3ML nebulizer solution 3 mL (3 mLs Nebulization Given 05/30/18 0952)  albuterol (PROVENTIL) (2.5 MG/3ML) 0.083% nebulizer solution 2.5 mg (2.5 mg Nebulization Given 05/30/18 0952)  ipratropium-albuterol (DUONEB) 0.5-2.5 (3) MG/3ML nebulizer solution 3 mL (3 mLs Nebulization Given 05/30/18 1154)  albuterol (PROVENTIL) (2.5 MG/3ML) 0.083% nebulizer solution 2.5 mg (2.5 mg Nebulization Given 05/30/18 1155)     Initial Impression / Assessment and Plan / ED Course  I have reviewed the triage vital signs and the nursing notes.  Pertinent labs & imaging results that were available during my care of the patient were reviewed by me and considered in my medical decision making (see chart for details).     Patient with exacerbation of asthma.  Patient improved with neb treatments.  She will be sent home with albuterol inhaler and prednisone and follow-up with PCP  Final Clinical Impressions(s) / ED Diagnoses   Final diagnoses:  Mild  intermittent asthma with exacerbation    ED Discharge Orders         Ordered    predniSONE (DELTASONE) 10 MG tablet  Daily     05/30/18 1245           Bethann Berkshire, MD 05/30/18 1249

## 2018-05-30 NOTE — ED Triage Notes (Signed)
Pt reports cough, chest pain with inspiration, sob and wheezing for past few days.  Has history of asthma and uses albuterol inhaler.  Reports has had no relief with inhaler.

## 2018-05-30 NOTE — Discharge Instructions (Signed)
Follow-up with a family doctor in the next week.  Return if any problems

## 2018-11-21 IMAGING — DX DG CHEST 2V
2 series · 2 of 2 positions shown · non-contrast
Comparison: Chest radiographs 04/23/2017.

CLINICAL DATA: 37-year-old female with shortness of breath wheezing
and cough since yesterday. Right chest pain radiating to the right
shoulder and back for 2 days.

EXAM:
CHEST - 2 VIEW

[chest pa]
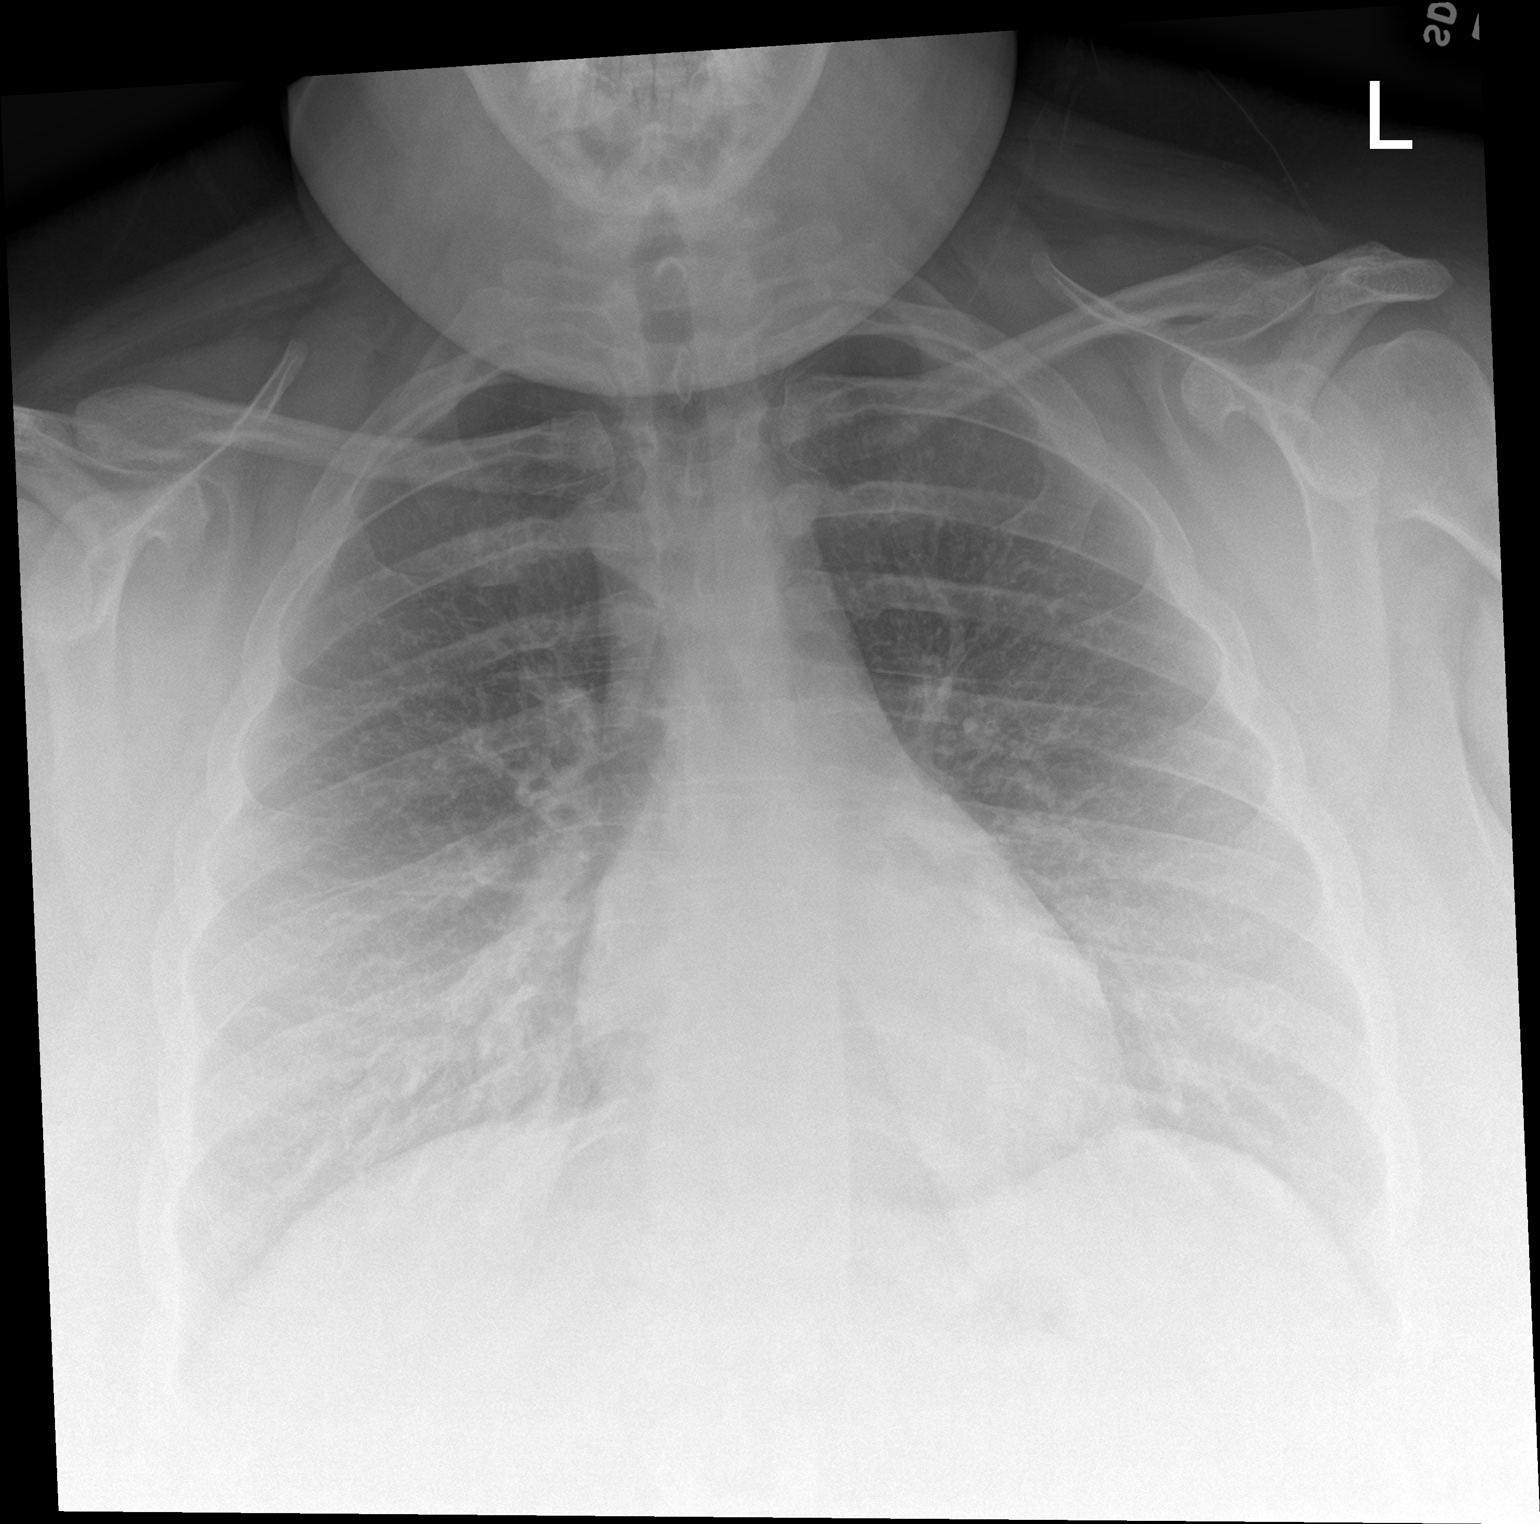

[chest lat]
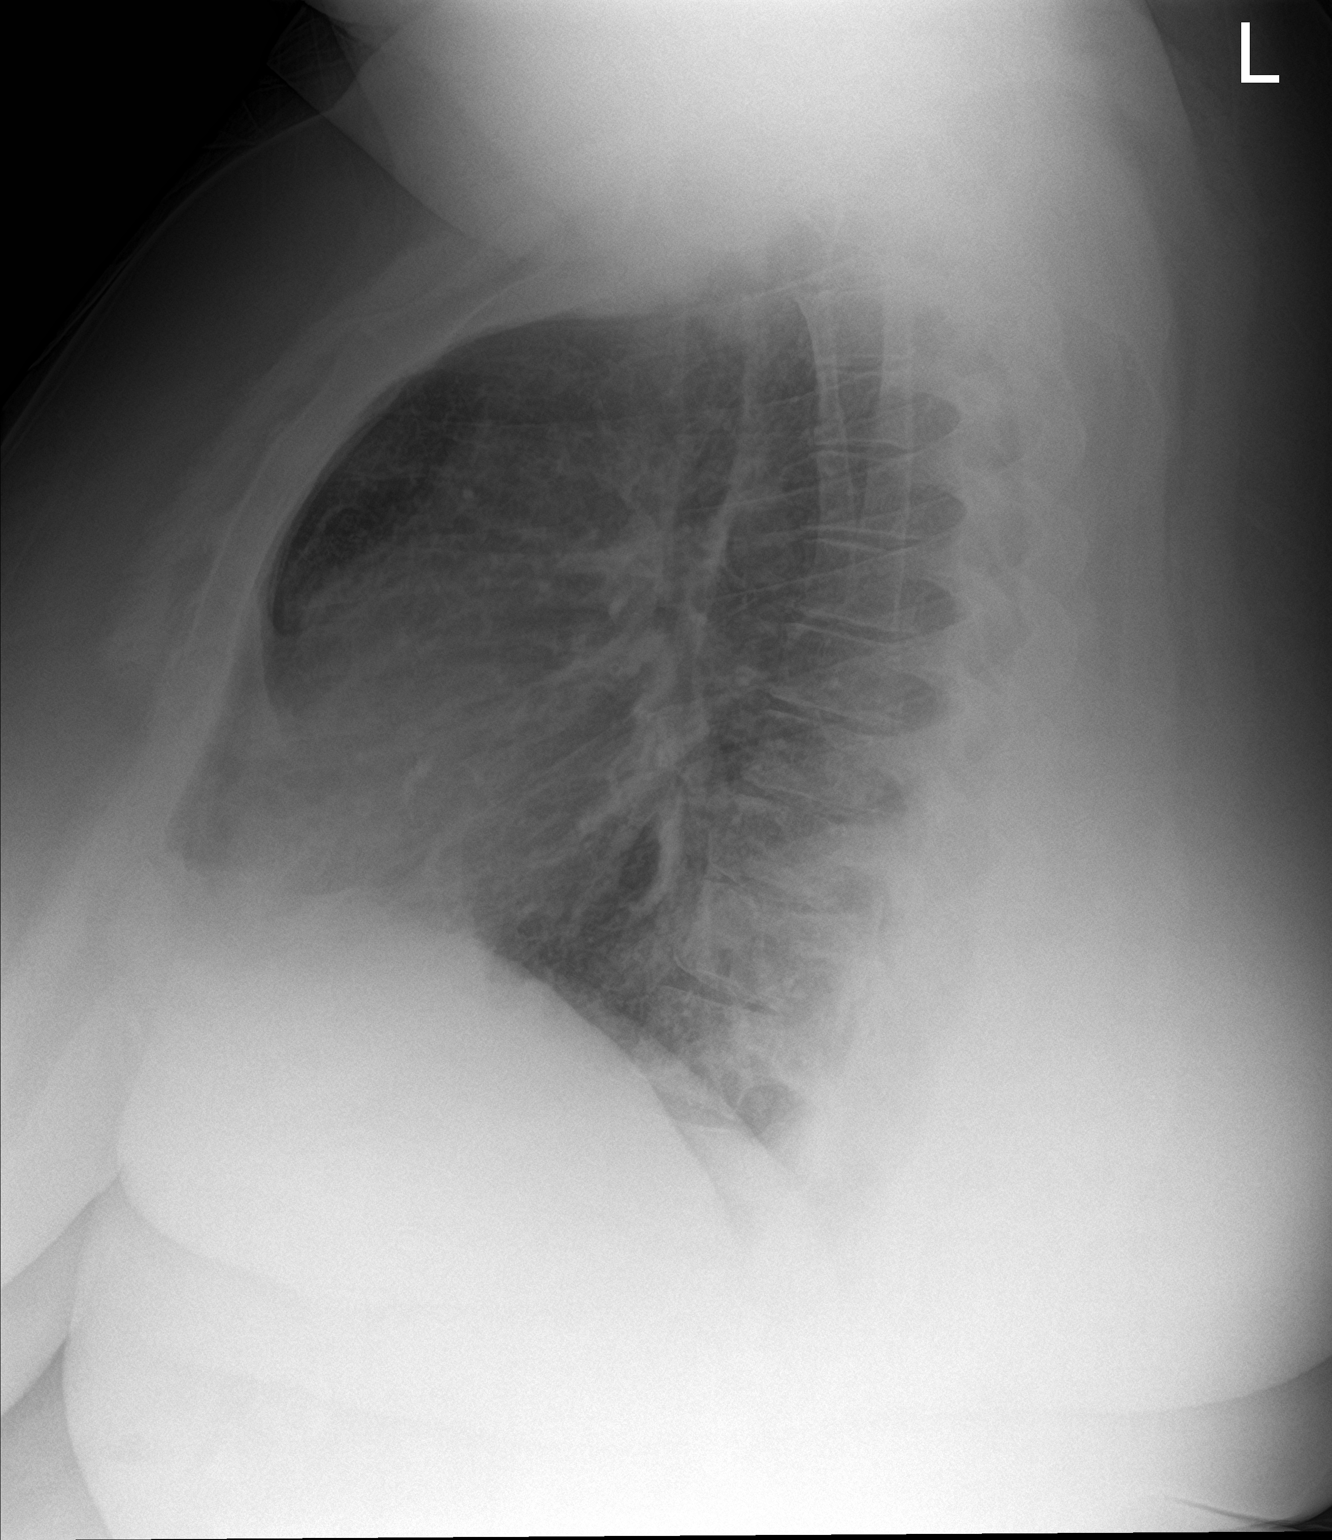

[2 of 2 positions shown; findings below may reference images not displayed]

FINDINGS: Large body habitus. Lung volumes are stable and within normal
limits. Normal cardiac size and mediastinal contours. Visualized
tracheal air column is within normal limits. Stable bilateral lung
parenchyma. No pneumothorax, pleural effusion or acute pulmonary
opacity. No acute osseous abnormality identified. Paucity of bowel
gas in the upper abdomen.
IMPRESSION: No acute cardiopulmonary abnormality.

## 2019-07-26 IMAGING — DX DG CHEST 2V
2 series · 2 of 2 positions shown · non-contrast
Comparison: 09/25/2017

CLINICAL DATA: Chest congestion, cough, shortness of breath,
current smoker

EXAM:
CHEST - 2 VIEW

[chest pa]
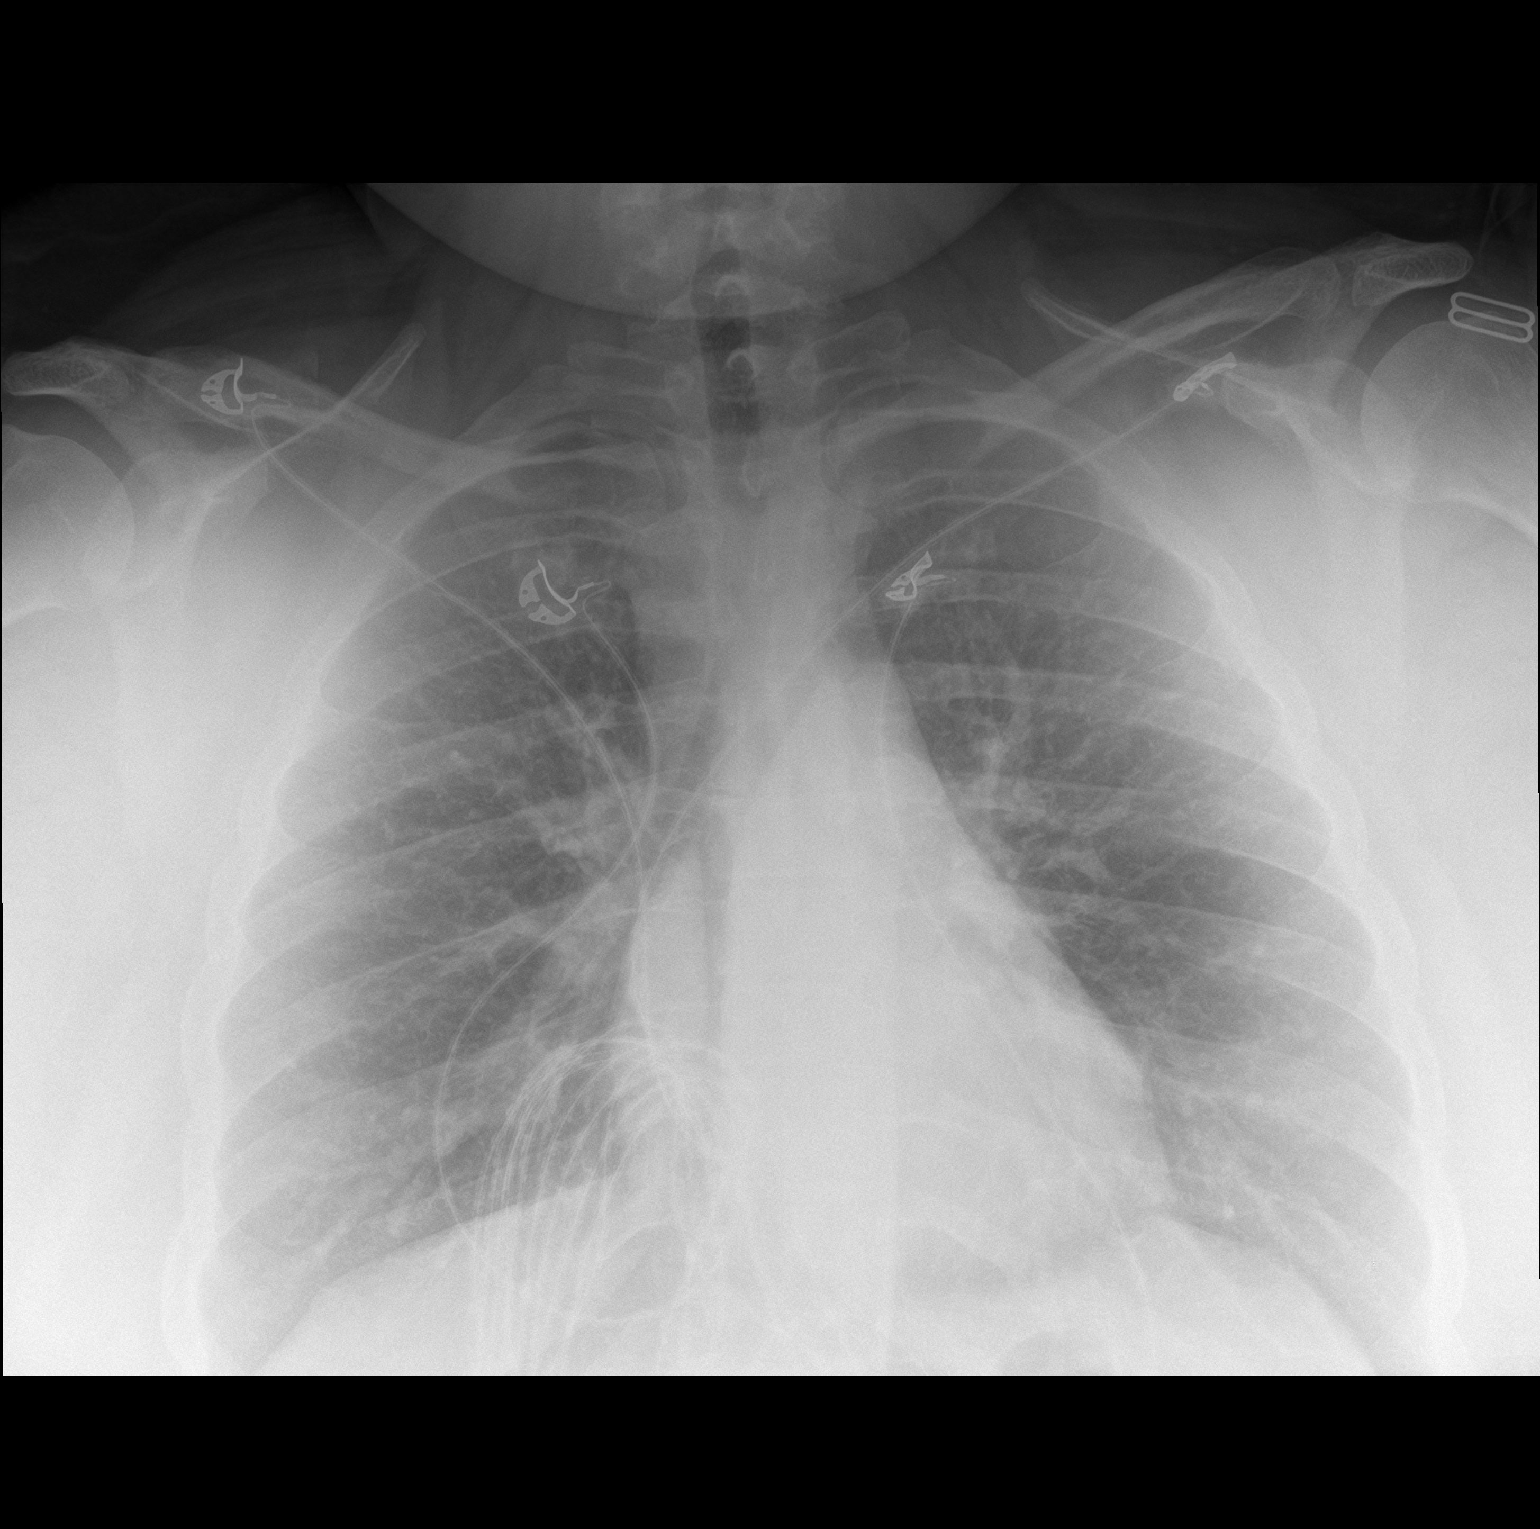

[chest lat]
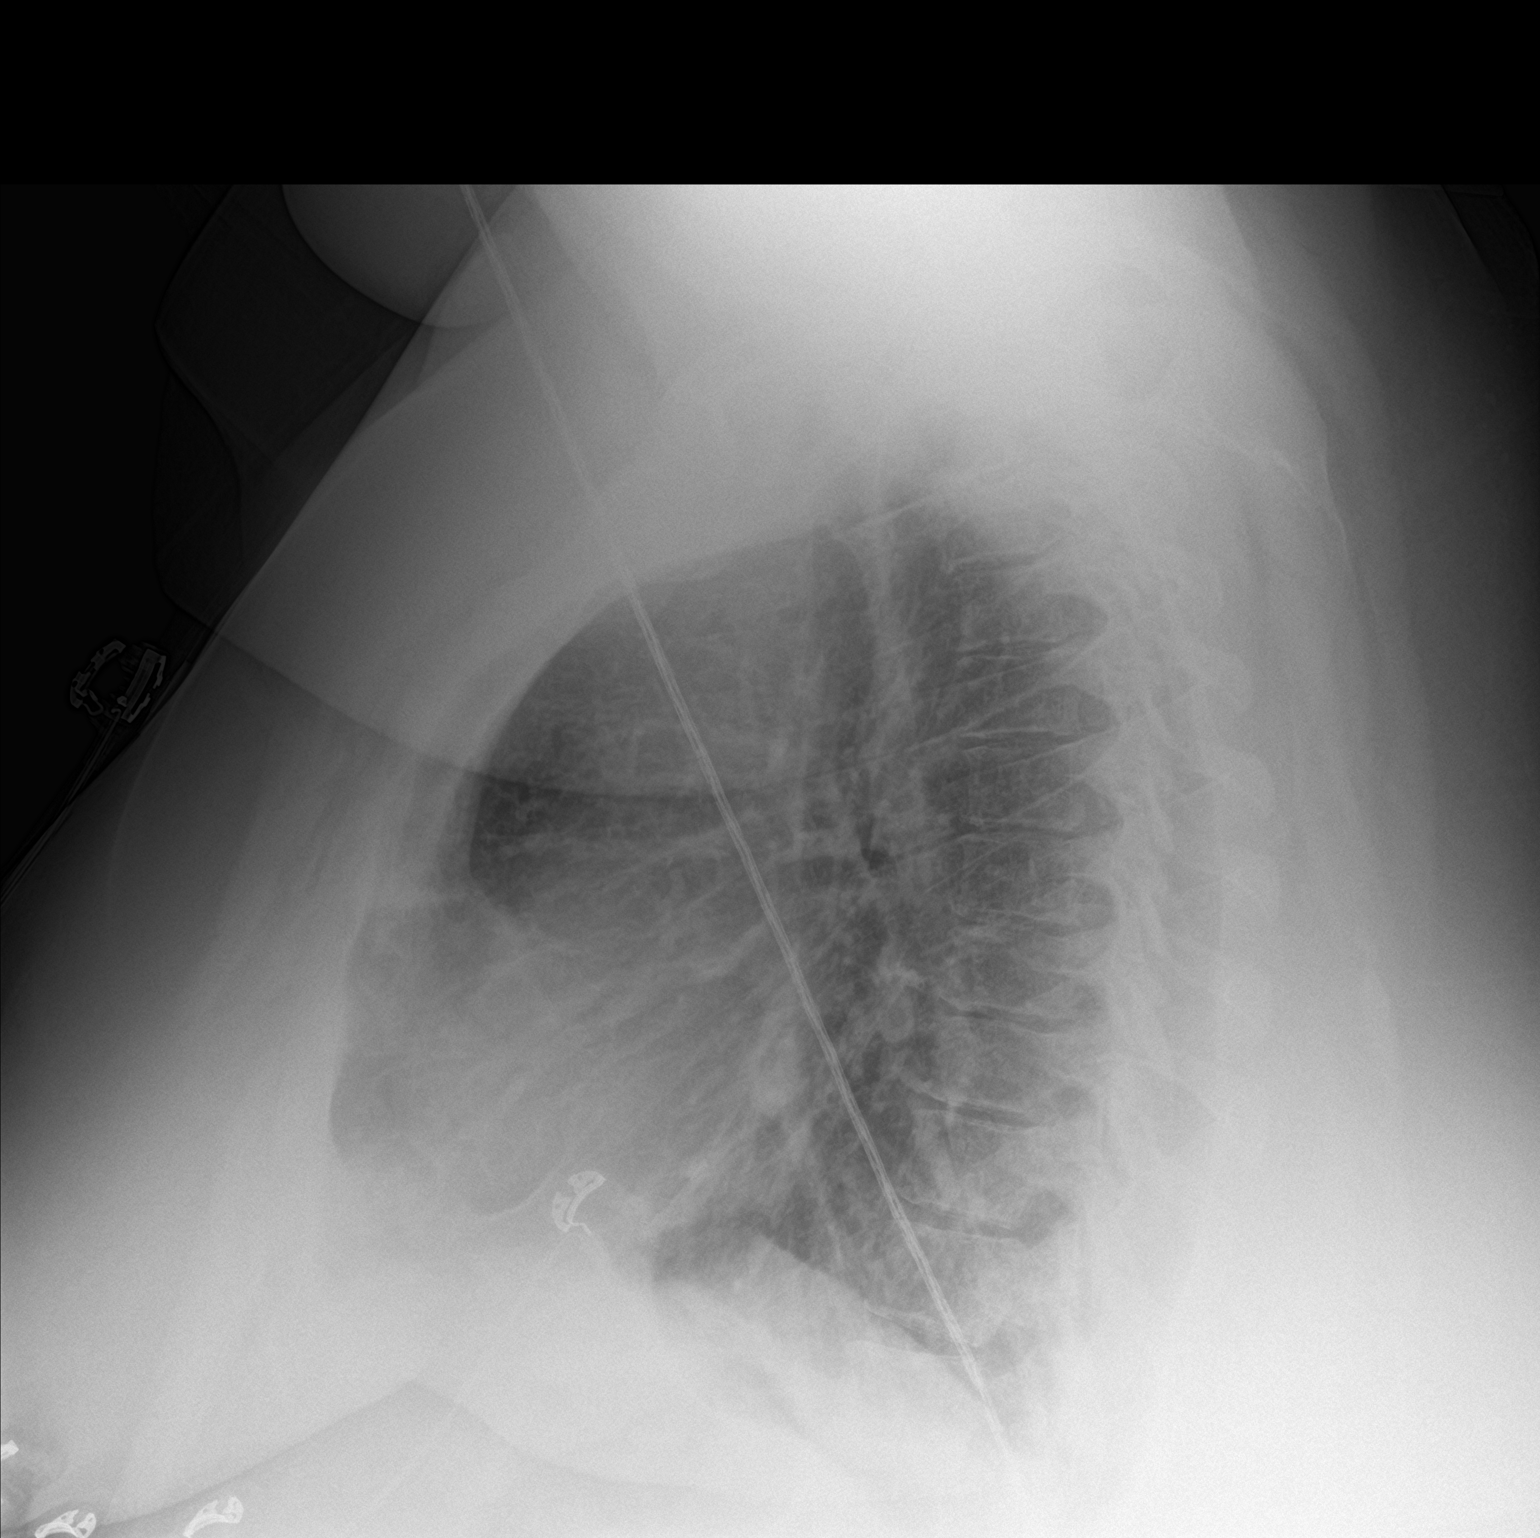

[2 of 2 positions shown; findings below may reference images not displayed]

FINDINGS: Normal heart size, mediastinal contours, and pulmonary vascularity.

Lungs clear.

No pulmonary infiltrate, pleural effusion, or pneumothorax.

Bones unremarkable.
IMPRESSION: No acute abnormalities.
